# Patient Record
Sex: Female | Born: 2000 | Race: White | Hispanic: No | Marital: Single
Health system: Southern US, Community
[De-identification: ages and names within clinical notes are randomized; demographics above are authoritative.]

## PROBLEM LIST (undated history)

## (undated) ENCOUNTER — Inpatient Hospital Stay (HOSPITAL_COMMUNITY): Payer: Self-pay

## (undated) DIAGNOSIS — R000 Tachycardia, unspecified: Secondary | ICD-10-CM

## (undated) DIAGNOSIS — F419 Anxiety disorder, unspecified: Secondary | ICD-10-CM

## (undated) DIAGNOSIS — O139 Gestational [pregnancy-induced] hypertension without significant proteinuria, unspecified trimester: Secondary | ICD-10-CM

## (undated) HISTORY — PX: NO PAST SURGERIES: SHX2092

## (undated) HISTORY — DX: Anxiety disorder, unspecified: F41.9

## (undated) HISTORY — DX: Gestational (pregnancy-induced) hypertension without significant proteinuria, unspecified trimester: O13.9

---
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
## 2021-01-07 DIAGNOSIS — F4312 Post-traumatic stress disorder, chronic: Secondary | ICD-10-CM | POA: Diagnosis not present

## 2021-01-14 DIAGNOSIS — F4312 Post-traumatic stress disorder, chronic: Secondary | ICD-10-CM | POA: Diagnosis not present

## 2021-01-21 DIAGNOSIS — F4312 Post-traumatic stress disorder, chronic: Secondary | ICD-10-CM | POA: Diagnosis not present

## 2021-01-28 DIAGNOSIS — F4312 Post-traumatic stress disorder, chronic: Secondary | ICD-10-CM | POA: Diagnosis not present

## 2021-02-04 DIAGNOSIS — F4312 Post-traumatic stress disorder, chronic: Secondary | ICD-10-CM | POA: Diagnosis not present

## 2021-02-11 DIAGNOSIS — F4312 Post-traumatic stress disorder, chronic: Secondary | ICD-10-CM | POA: Diagnosis not present

## 2021-02-18 DIAGNOSIS — F4312 Post-traumatic stress disorder, chronic: Secondary | ICD-10-CM | POA: Diagnosis not present

## 2021-02-25 DIAGNOSIS — F4312 Post-traumatic stress disorder, chronic: Secondary | ICD-10-CM | POA: Diagnosis not present

## 2021-03-04 DIAGNOSIS — F4312 Post-traumatic stress disorder, chronic: Secondary | ICD-10-CM | POA: Diagnosis not present

## 2021-03-11 DIAGNOSIS — F4312 Post-traumatic stress disorder, chronic: Secondary | ICD-10-CM | POA: Diagnosis not present

## 2021-03-18 DIAGNOSIS — F4312 Post-traumatic stress disorder, chronic: Secondary | ICD-10-CM | POA: Diagnosis not present

## 2021-03-25 DIAGNOSIS — F4312 Post-traumatic stress disorder, chronic: Secondary | ICD-10-CM | POA: Diagnosis not present

## 2021-04-01 DIAGNOSIS — F4312 Post-traumatic stress disorder, chronic: Secondary | ICD-10-CM | POA: Diagnosis not present

## 2021-04-08 DIAGNOSIS — F4312 Post-traumatic stress disorder, chronic: Secondary | ICD-10-CM | POA: Diagnosis not present

## 2021-04-15 DIAGNOSIS — F4312 Post-traumatic stress disorder, chronic: Secondary | ICD-10-CM | POA: Diagnosis not present

## 2021-04-22 DIAGNOSIS — F4312 Post-traumatic stress disorder, chronic: Secondary | ICD-10-CM | POA: Diagnosis not present

## 2021-04-29 DIAGNOSIS — F4312 Post-traumatic stress disorder, chronic: Secondary | ICD-10-CM | POA: Diagnosis not present

## 2021-05-06 DIAGNOSIS — F4312 Post-traumatic stress disorder, chronic: Secondary | ICD-10-CM | POA: Diagnosis not present

## 2021-05-13 DIAGNOSIS — F4312 Post-traumatic stress disorder, chronic: Secondary | ICD-10-CM | POA: Diagnosis not present

## 2021-05-20 DIAGNOSIS — F4312 Post-traumatic stress disorder, chronic: Secondary | ICD-10-CM | POA: Diagnosis not present

## 2021-05-28 DIAGNOSIS — F4312 Post-traumatic stress disorder, chronic: Secondary | ICD-10-CM | POA: Diagnosis not present

## 2021-06-10 DIAGNOSIS — F4312 Post-traumatic stress disorder, chronic: Secondary | ICD-10-CM | POA: Diagnosis not present

## 2021-08-22 IMAGING — US US MFM OB COMP +14 WKS
1 series · 13 of 28 positions shown · non-contrast
Comparison: none

[Series 1: us mfm ob comp +14 wks · 13 of 109 slices shown]
[im 5/109]
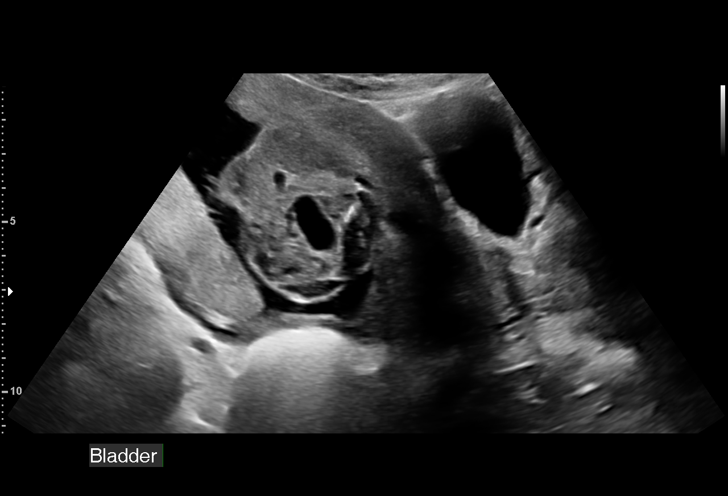
[im 13/109]
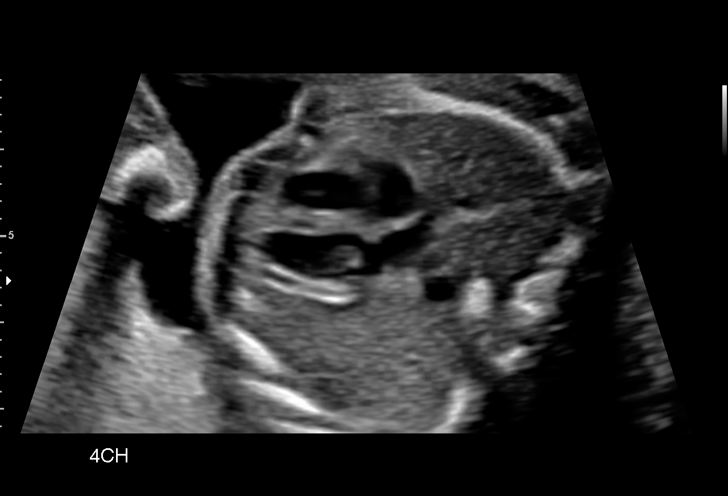
[im 21/109]
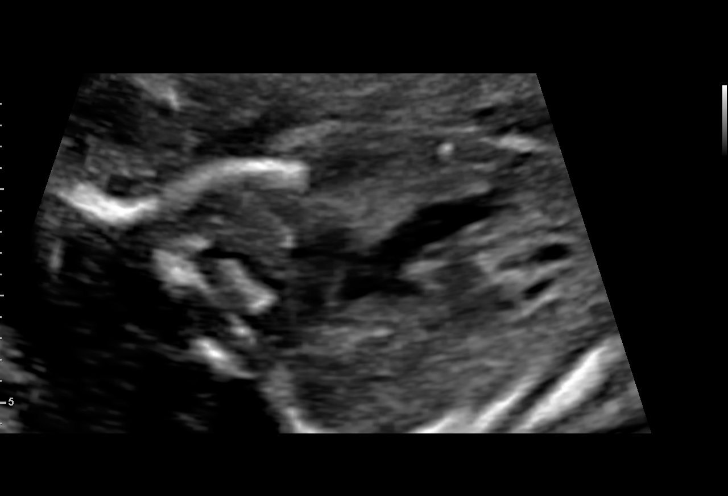
[im 29/109]
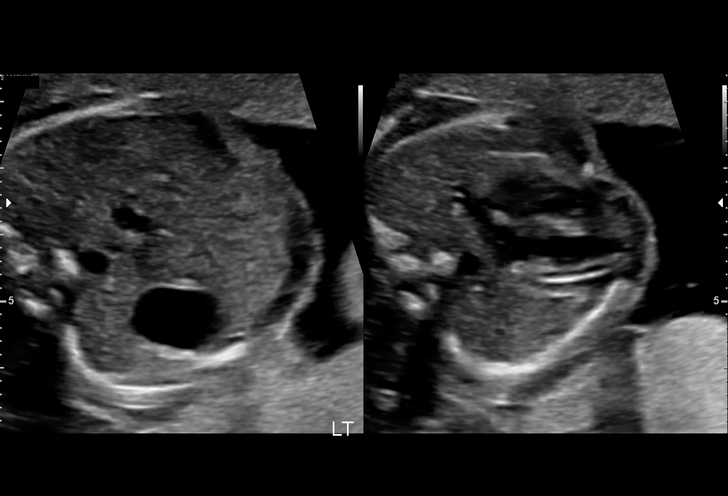
[im 37/109]
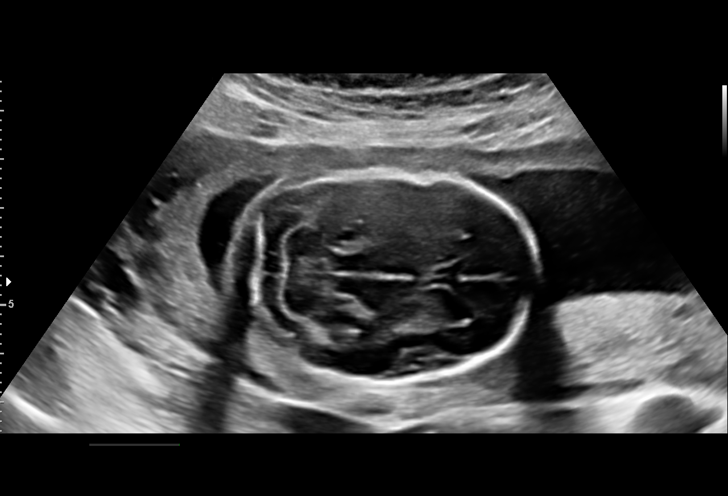
[im 45/109]
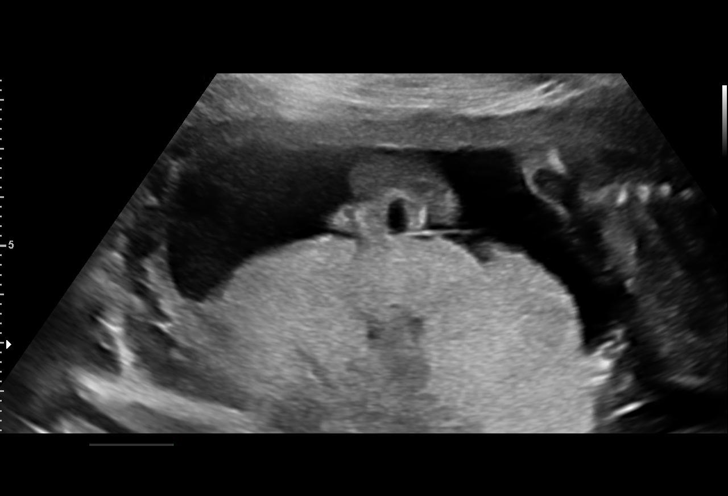
[im 57/109]
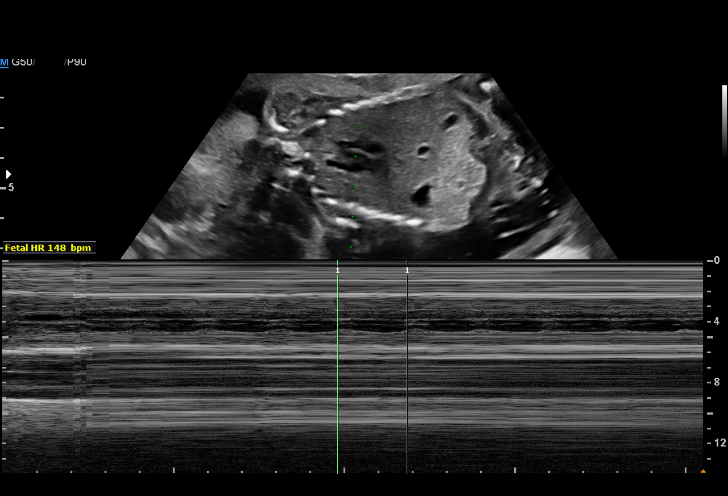
[im 65/109]
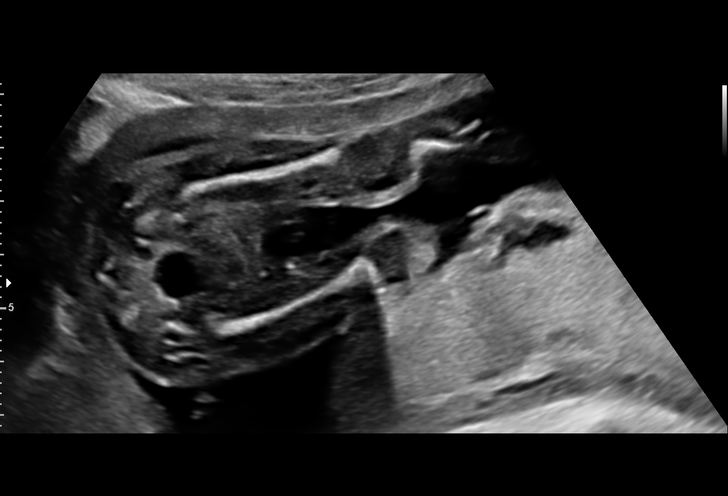
[im 73/109]
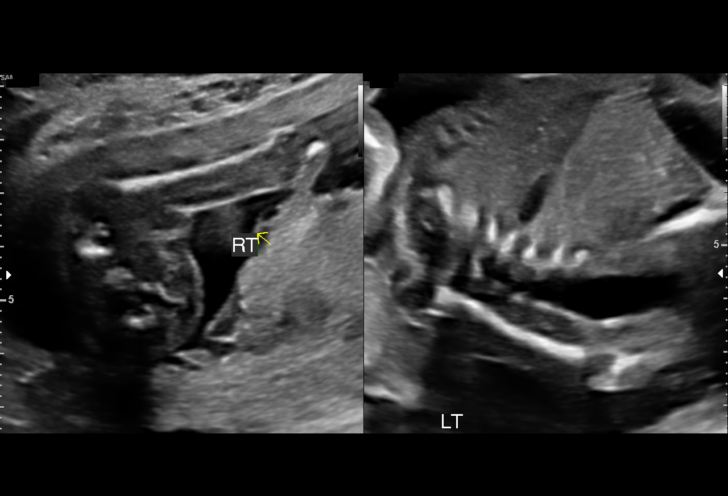
[im 81/109]
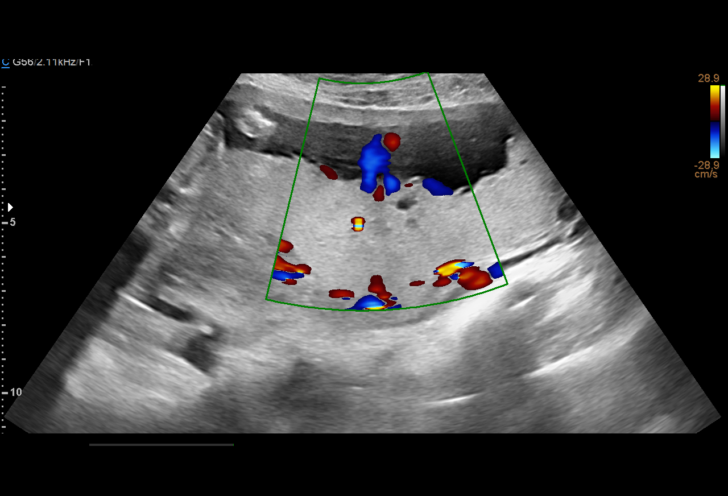
[im 89/109]
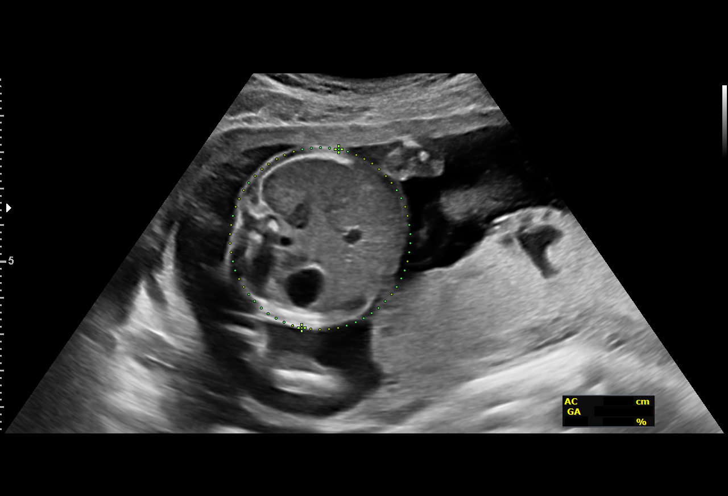
[im 97/109]
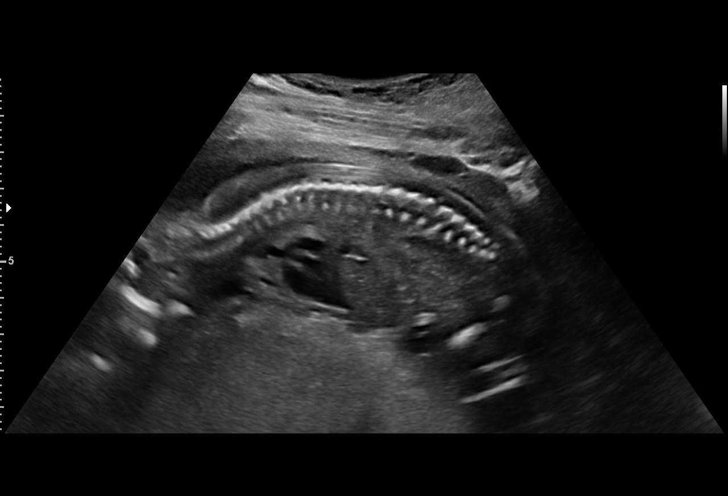
[im 105/109]
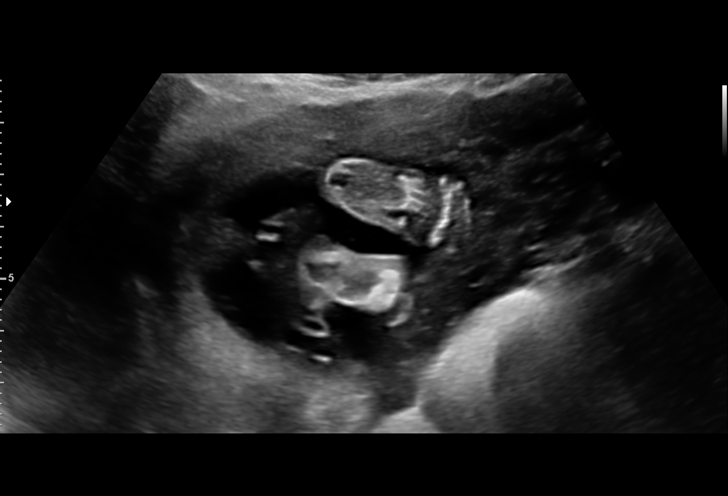

[13 of 28 positions shown; findings below may reference images not displayed]

Indications

 Poor obstetric history: Previous
 preeclampsia / eclampsia/gestational HTN
 (ASA)
 Encounter for antenatal screening for
 malformations
 19 weeks gestation of pregnancy
Fetal Evaluation

 Num Of Fetuses:         1
 Fetal Heart Rate(bpm):  148
 Cardiac Activity:       Observed
 Presentation:           Variable
 Placenta:               Posterior
 P. Cord Insertion:      Visualized, central

 Amniotic Fluid
 AFI FV:      Within normal limits

                             Largest Pocket(cm)

Biometry

 BPD:      45.6  mm     G. Age:  19w 5d         81  %    CI:        67.47   %    70 - 86
                                                         FL/HC:      17.3   %    16.1 -
 HC:      177.7  mm     G. Age:  20w 2d         91  %    HC/AC:      1.15        1.09 -
 AC:      154.2  mm     G. Age:  20w 4d         90  %    FL/BPD:     67.3   %
 FL:       30.7  mm     G. Age:  19w 4d         62  %    FL/AC:      19.9   %    20 - 24
 CER:      19.8  mm     G. Age:  18w 6d         47  %
 NFT:       4.2  mm
 CM:        4.1  mm
 Est. FW:     332  gm    0 lb 12 oz      96  %
OB History

 Gravidity:    2         Term:   1
 Living:       1
Gestational Age

 LMP:           19w 0d        Date:  12/20/19                 EDD:   09/25/20
 U/S Today:     20w 0d                                        EDD:   09/18/20
 Best:          19w 0d     Det. By:  LMP  (12/20/19)          EDD:   09/25/20
Anatomy

 Cranium:               Appears normal         LVOT:                   Appears normal
 Cavum:                 Appears normal         Aortic Arch:            Appears normal
 Ventricles:            Appears normal         Ductal Arch:            Appears normal
 Choroid Plexus:        Appears normal         Diaphragm:              Appears normal
 Cerebellum:            Appears normal         Stomach:                Appears normal, left
                                                                       sided
 Posterior Fossa:       Appears normal         Abdomen:                Appears normal
 Nuchal Fold:           Appears normal         Abdominal Wall:         Appears nml (cord
                                                                       insert, abd wall)
 Face:                  Appears normal         Cord Vessels:           Appears normal (3
                        (orbits and profile)                           vessel cord)
 Lips:                  Appears normal         Kidneys:                Appear normal
 Palate:                Appears normal         Bladder:                Appears normal
 Thoracic:              Appears normal         Spine:                  Appears normal
 Heart:                 Appears normal         Upper Extremities:      Appears normal
                        (4CH, axis, and
                        situs)
 RVOT:                  Appears normal         Lower Extremities:      Appears normal

 Other:  Fetus appears to be a male. Heels visualized. Open hands visualized.
         Nasal bone visualized.
Cervix Uterus Adnexa

 Cervix
 Length:            3.7  cm.
 Normal appearance by transabdominal scan.

 Uterus
 No abnormality visualized.

 Right Ovary
 No adnexal mass visualized.

 Left Ovary
 No adnexal mass visualized.

 Cul De Sac
 No free fluid seen.

 Adnexa
 No abnormality visualized.
Impression

 We performed fetal anatomy scan. No makers of
 aneuploidies or fetal structural defects are seen. Fetal
 biometry is 1 week ahead of established gestational age (by
 LMP). Amniotic fluid is normal and good fetal activity is seen.
 Patient understands the limitations of ultrasound in detecting
 fetal anomalies.

 On cell-free fetal DNA screening, the risks of fetal
 aneuploidies are not increased .
 Obstetric history is significant for a term vaginal delivery in
 [DATE] of a male infant weighing 3,476 g at birth. Her
 pregnancy was complicated by gestational hypertension.
Recommendations

 -An appointment was made for her to return in 4 weeks for
 fetal growth assessment (to confirm EDD).
                 Locklear, Savio

## 2021-09-12 IMAGING — DX DG HAND COMPLETE 3+V*R*
3 series · 3 of 3 positions shown · non-contrast
Comparison: None

CLINICAL DATA: RIGHT hand and wrist pain for 2 days, has been
moving and lifting heavy objects question injury, pain at thumb to
middle finger yesterday

EXAM:
RIGHT HAND - COMPLETE 3+ VIEW

[hand pa]
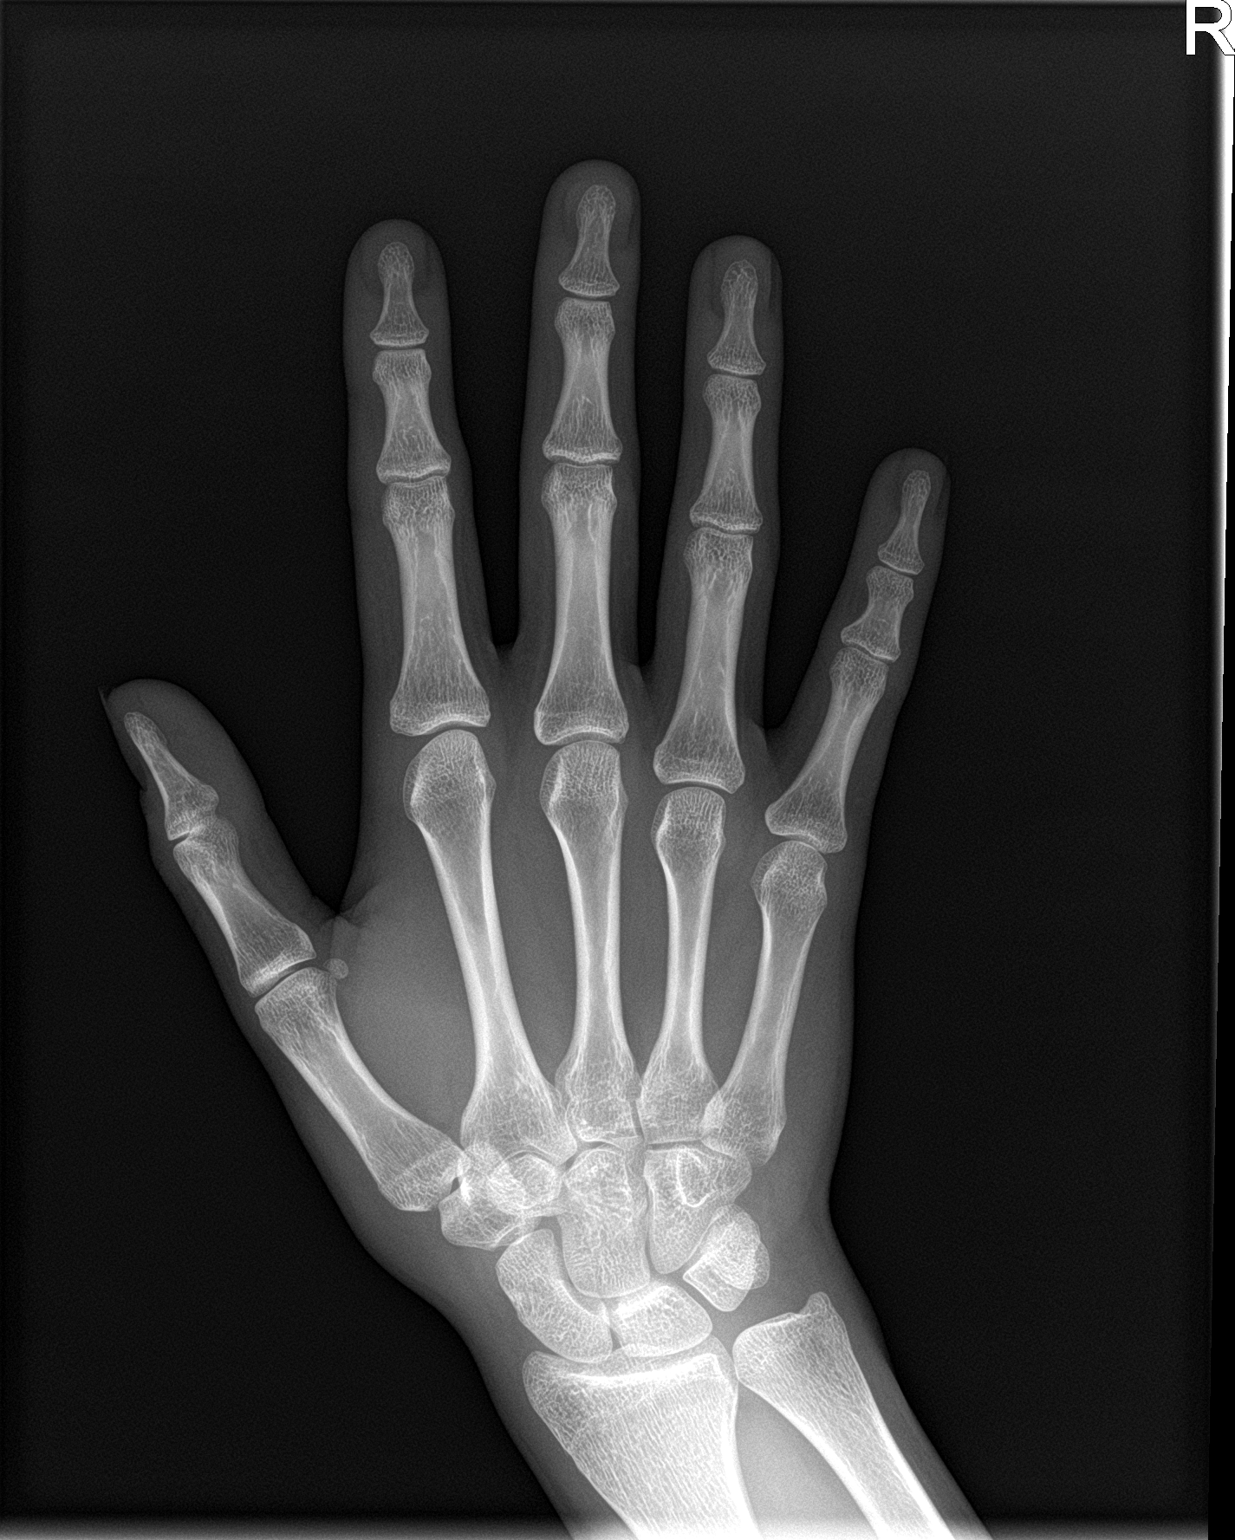

[hand obl]
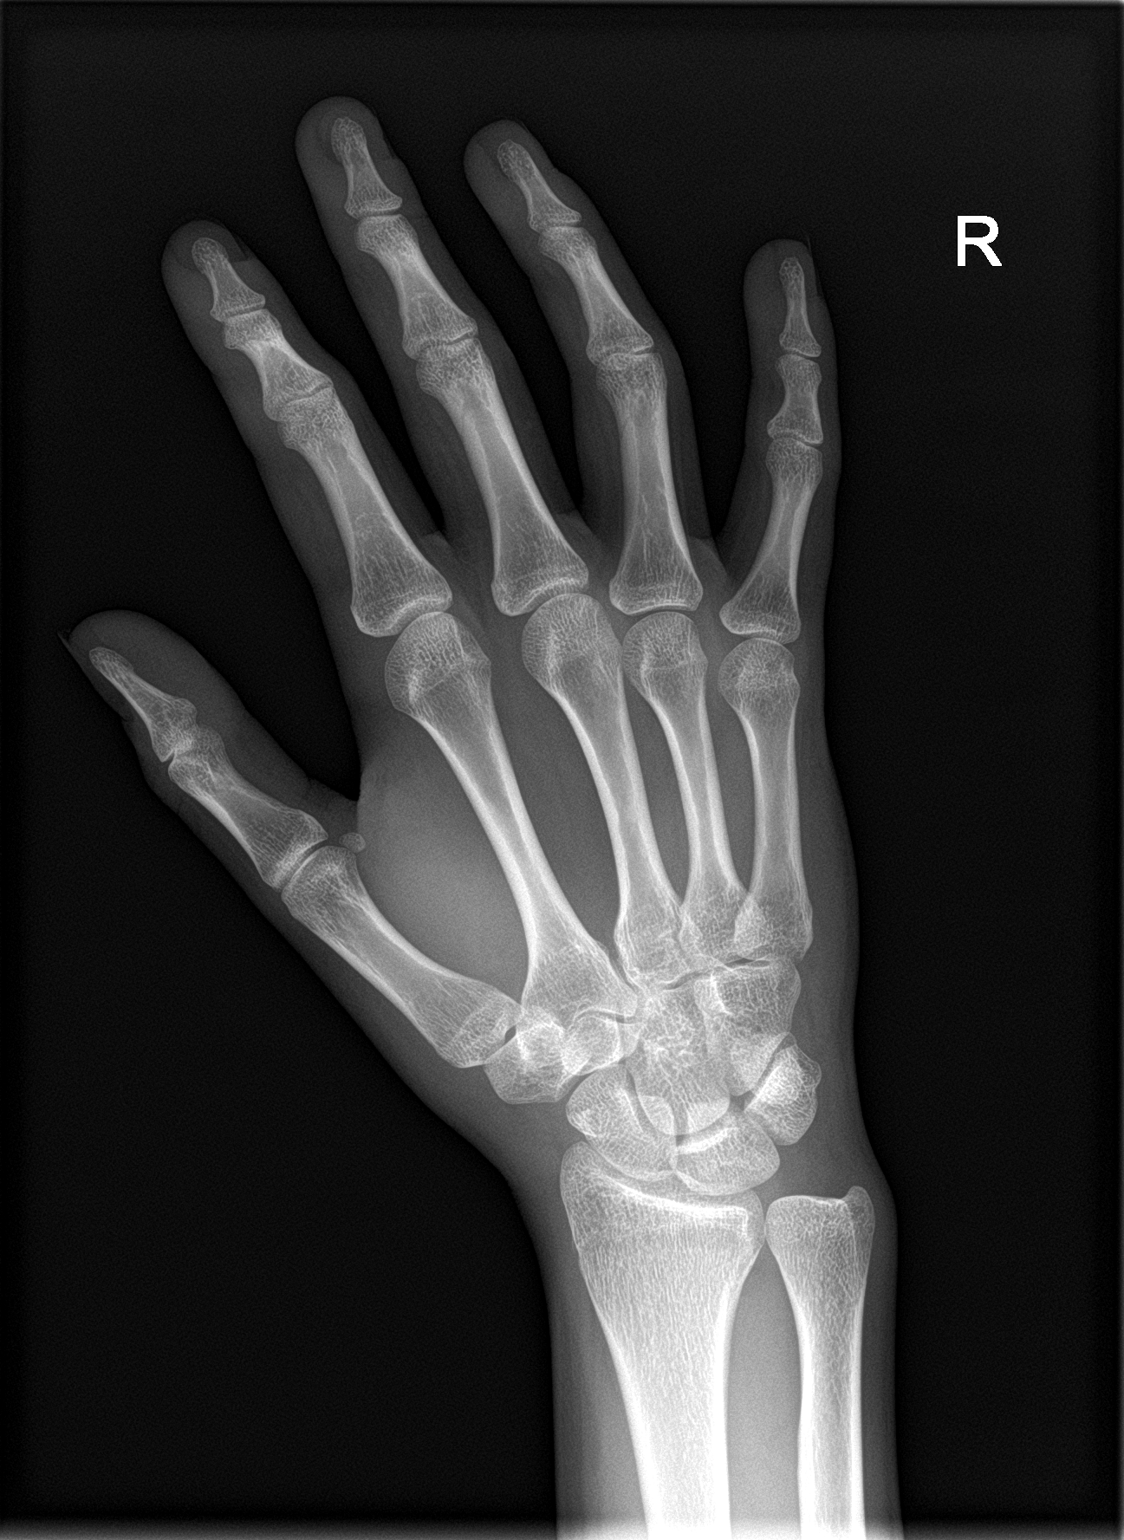

[hand lat]
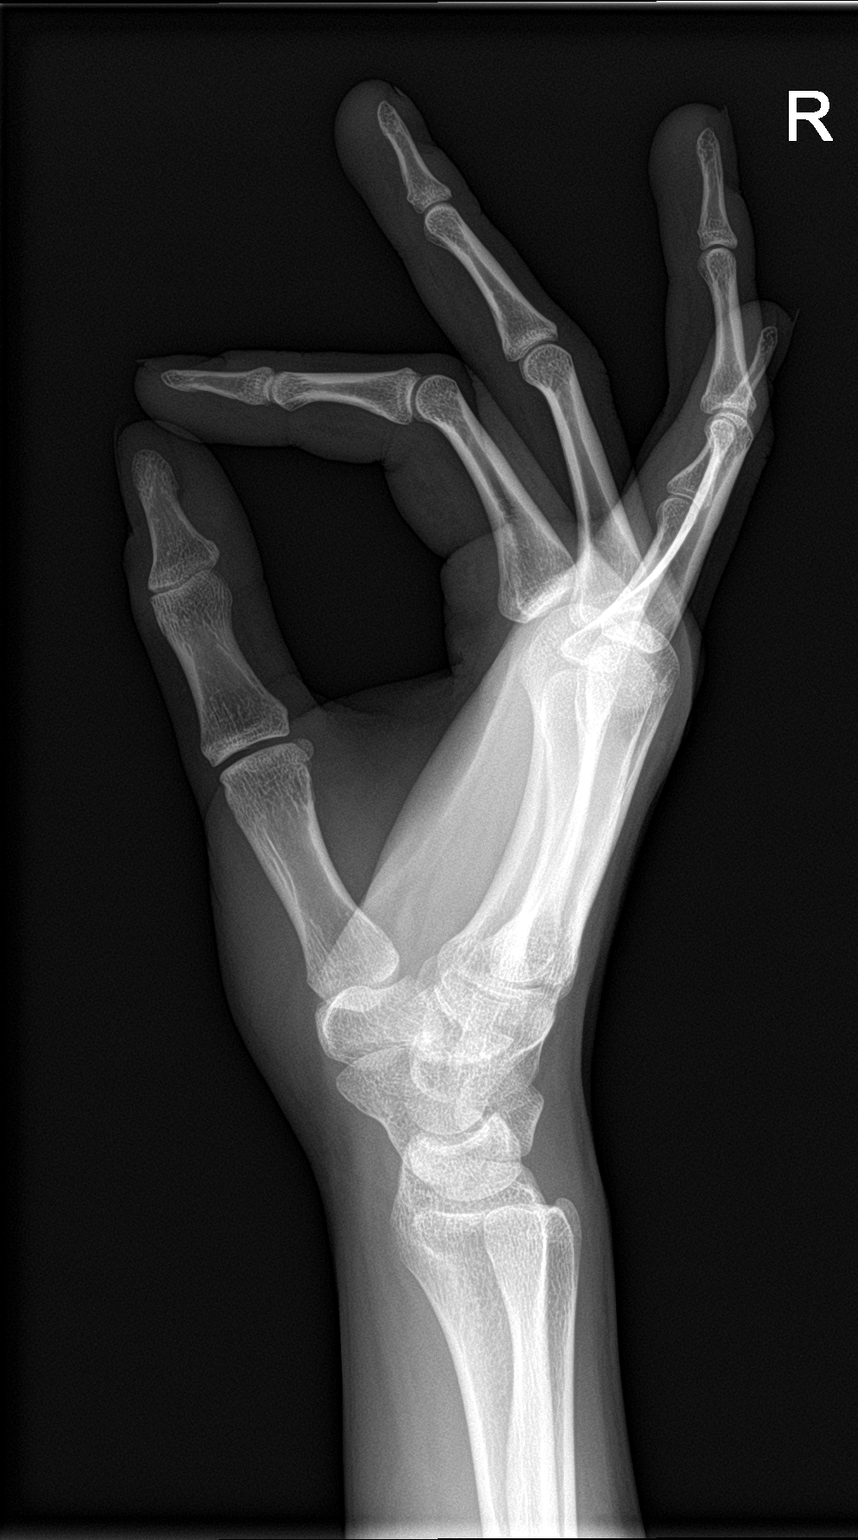

[3 of 3 positions shown; findings below may reference images not displayed]

FINDINGS: Osseous mineralization normal.

Joint spaces preserved.

No fracture, dislocation, or bone destruction.
IMPRESSION: Normal exam.

## 2021-10-04 IMAGING — US US MFM OB FOLLOW-UP
1 series · 13 of 28 positions shown · non-contrast
Comparison: none

[Series 1: us mfm ob follow-up · 13 of 58 slices shown]
[im 3/58]
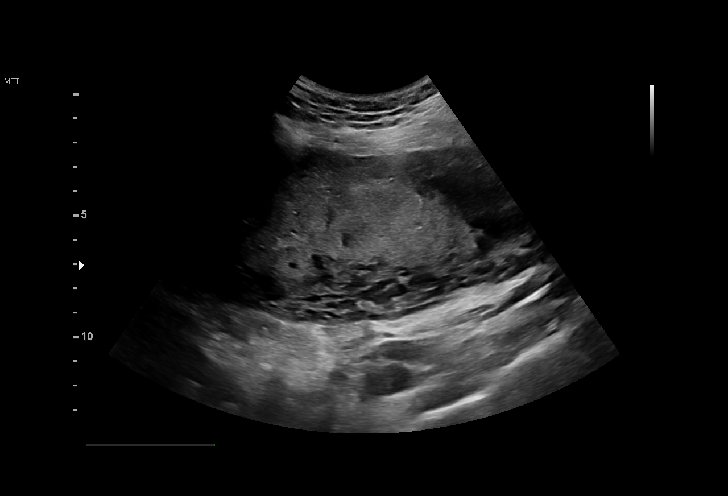
[im 7/58]
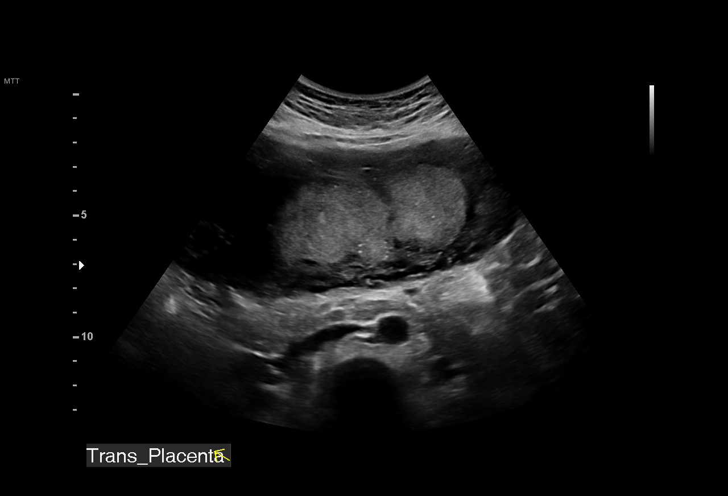
[im 11/58]
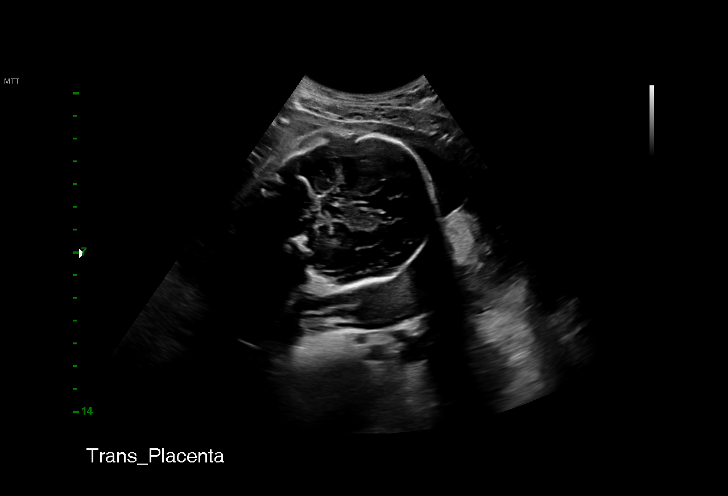
[im 15/58]
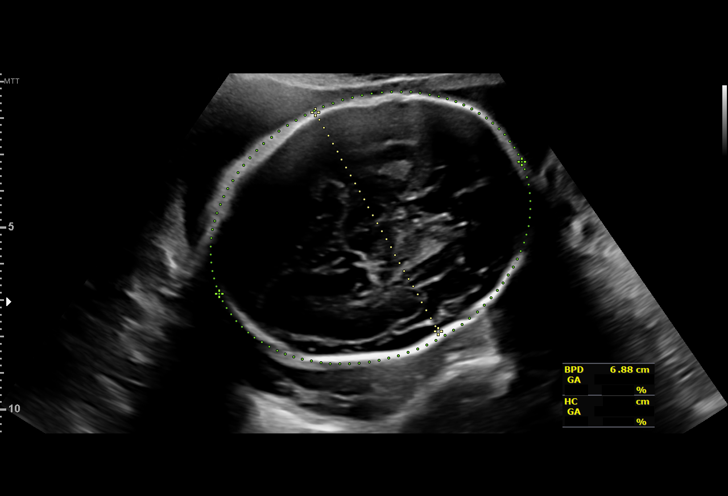
[im 20/58]
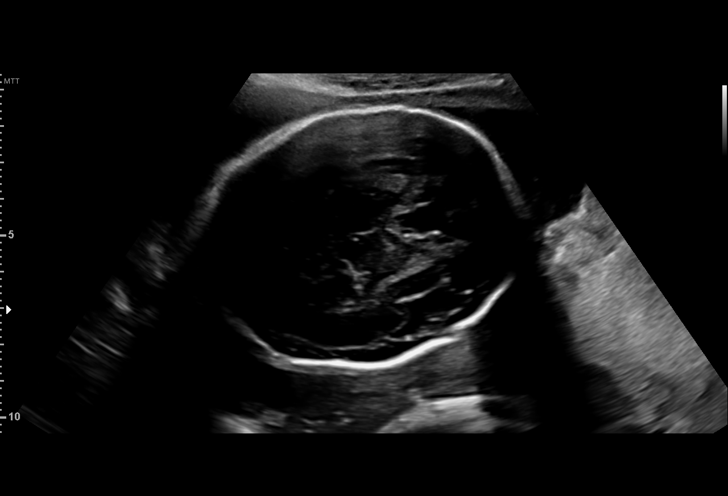
[im 24/58]
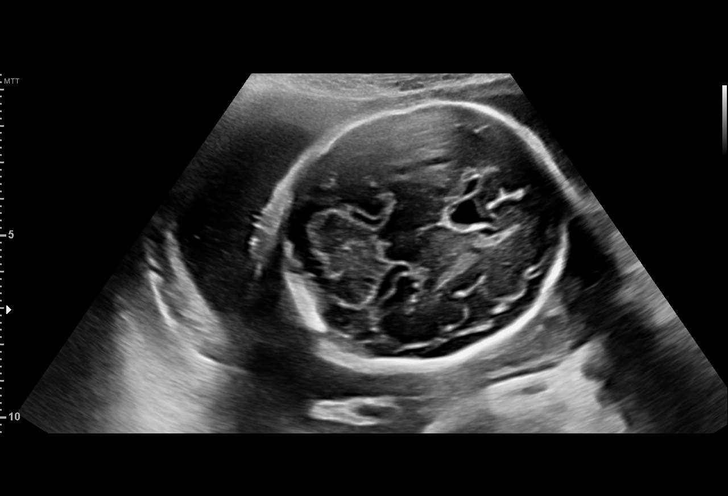
[im 30/58]
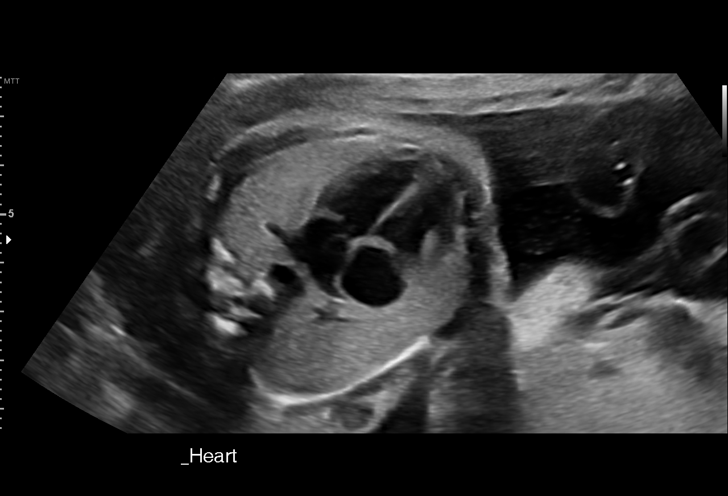
[im 34/58]
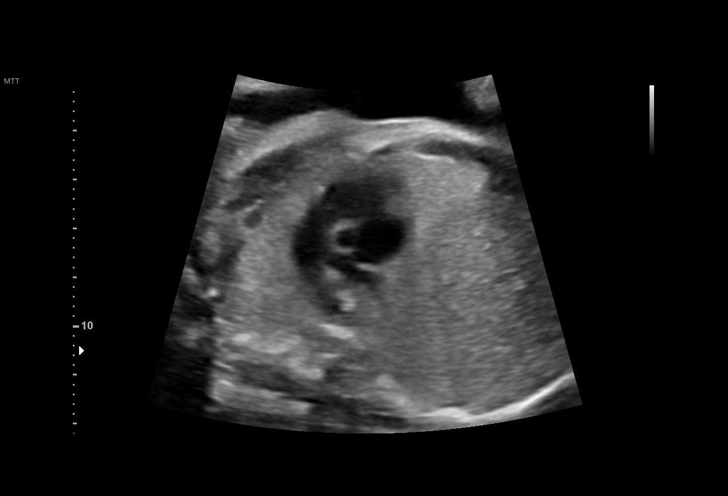
[im 39/58]
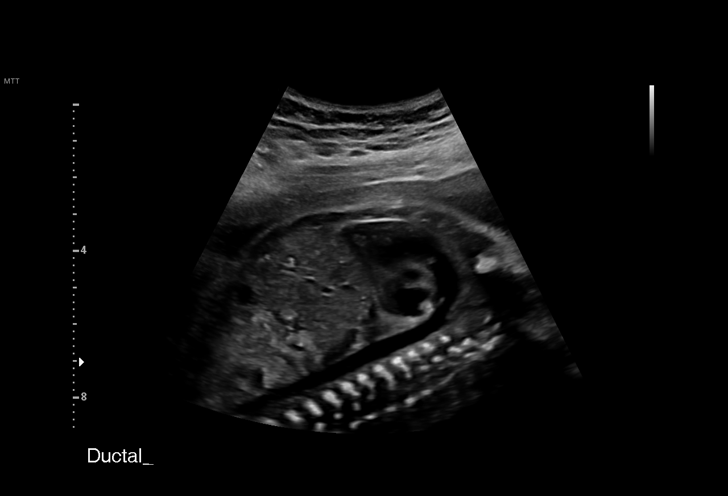
[im 43/58]
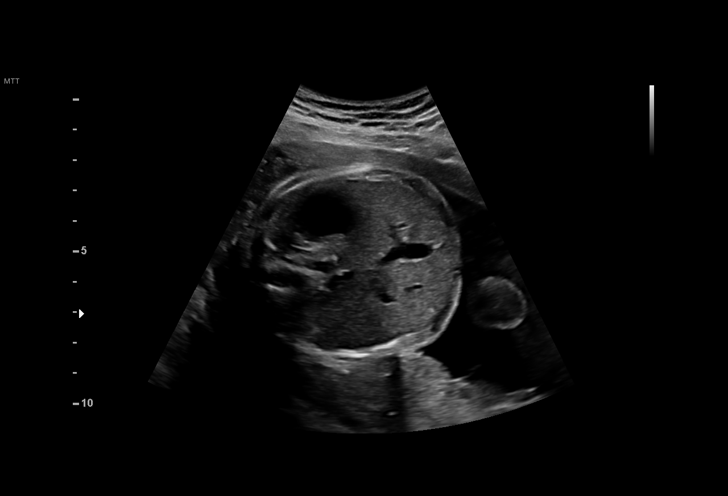
[im 47/58]
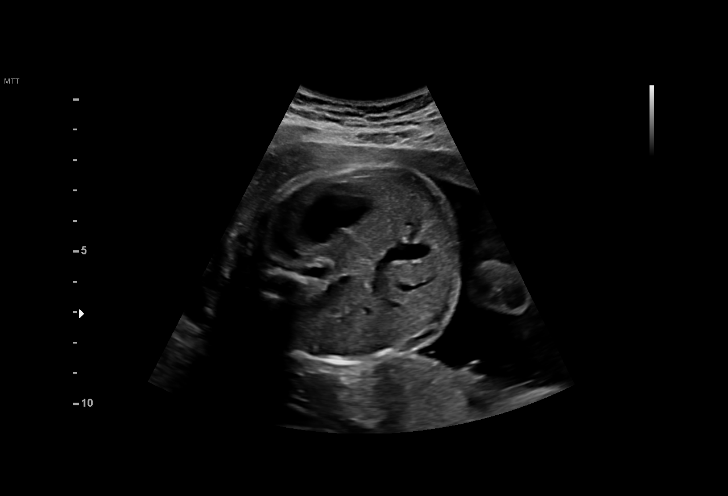
[im 51/58]
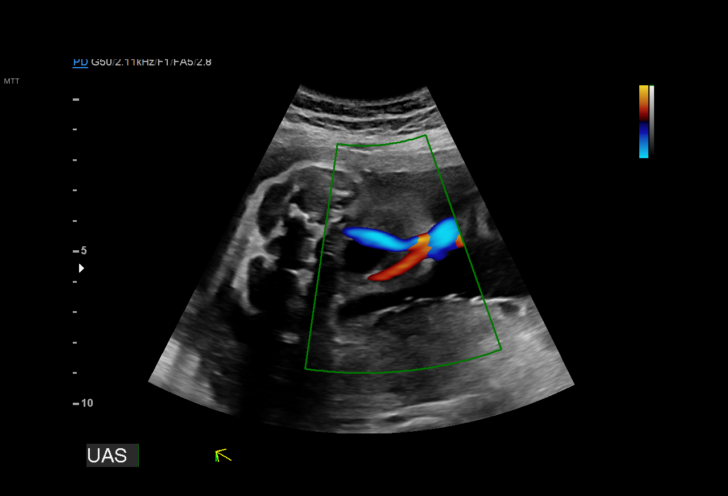
[im 55/58]
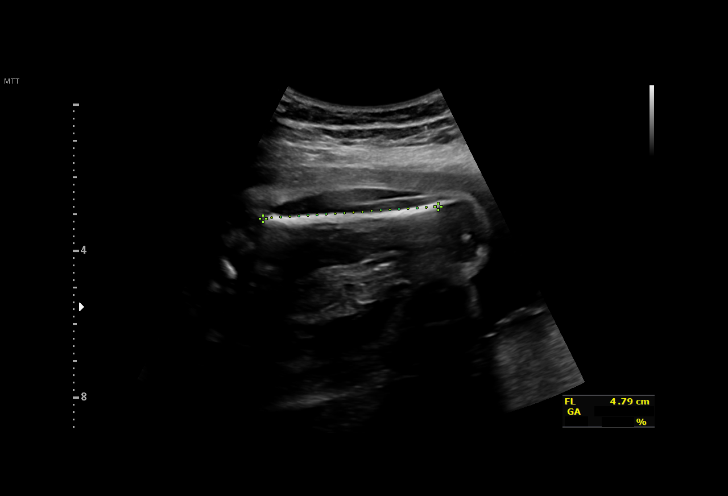

[13 of 28 positions shown; findings below may reference images not displayed]

Indications

 25 weeks gestation of pregnancy
 Poor obstetric history: Previous preeclampsia /
 eclampsia/gestational HTN (ASA)
 Encounter for other antenatal screening
 follow-up
 Size-Date Discrepancy
Fetal Evaluation

 Num Of Fetuses:          1
 Fetal Heart Rate(bpm):   125
 Cardiac Activity:        Observed
 Presentation:            Cephalic
 Placenta:                Posterior
 P. Cord Insertion:       Previously Visualized

 Amniotic Fluid
 AFI FV:      Within normal limits
Biometry

 BPD:      69.1   mm     G. Age:  27w 5d         99  %    CI:         74.55  %    70 - 86
                                                          FL/HC:       18.8  %    18.7 -
 HC:        254   mm     G. Age:  27w 4d         96  %    HC/AC:       1.14       1.04 -
 AC:      223.7   mm     G. Age:  26w 6d         87  %    FL/BPD:      69.2  %    71 - 87
 FL:       47.8   mm     G. Age:  26w 0d         63  %    FL/AC:       21.4  %    20 - 24
 CER:      28.6   mm     G. Age:  25w 2d         55  %

 LV:         4.4  mm

 Est. FW:     964   gm      2 lb 2 oz     94  %
OB History

 Gravidity:     2         Term:  1
 Living:        1
Gestational Age

 LMP:            25w 1d       Date:  12/20/19                   EDD:  09/25/20
 U/S Today:      27w 0d                                         EDD:  09/12/20
 Best:           25w 1d    Det. By:  LMP  (12/20/19)            EDD:  09/25/20
Anatomy

 Cranium:                Appears normal         LVOT:                   Appears normal
 Cavum:                  Appears normal         Aortic Arch:            Previously seen
 Ventricles:             Appears normal         Ductal Arch:            Previously seen
 Choroid Plexus:         Previously seen        Diaphragm:              Appears normal
 Cerebellum:             Appears normal         Stomach:                Appears normal, left
                                                                        sided
 Posterior Fossa:        Previously seen        Abdomen:                Appears normal
 Nuchal Fold:            Previously seen        Abdominal Wall:         Appears nml (cord
                                                                        insert, abd wall)
 Face:                   Orbits and profile     Cord Vessels:           Appears normal (3
                         previously seen                                vessel cord)
 Lips:                   Appears normal         Kidneys:                Appear normal
 Palate:                 Previously seen        Bladder:                Appears normal
 Thoracic:               Previously seen        Spine:                  Previously seen
 Heart:                  Appears normal         Upper Extremities:      Previously seen
                         (4CH, axis, and situs)
 RVOT:                   Appears normal         Lower Extremities:      Previously seen

 Other:   Fetus appears to be a male. Heels visualized. Open hands visualized.
          Nasal bone visualized.
Cervix Uterus Adnexa

 Cervix
 Length:            3.29  cm.
 Normal appearance by transabdominal scan.

 Uterus
 No abnormality visualized.

 Right Ovary
 Within normal limits. No adnexal mass visualized.

 Left Ovary
 Within normal limits. No adnexal mass visualized.

 Cul De Sac
 No free fluid seen.

 Adnexa
 No abnormality visualized.
Impression

 Follow up growth with measurement consistent with dates
 Good fetal movement and amniotic fluid
 Ms. Godenschweig had a large for gestational age with EFW 94%.
Recommendations

 Follow up as clinically indicated.

## 2024-01-11 DIAGNOSIS — R42 Dizziness and giddiness: Secondary | ICD-10-CM | POA: Diagnosis not present

## 2024-01-11 DIAGNOSIS — R0981 Nasal congestion: Secondary | ICD-10-CM | POA: Diagnosis not present

## 2024-01-11 DIAGNOSIS — R059 Cough, unspecified: Secondary | ICD-10-CM | POA: Diagnosis not present

## 2024-01-11 DIAGNOSIS — Z5321 Procedure and treatment not carried out due to patient leaving prior to being seen by health care provider: Secondary | ICD-10-CM | POA: Diagnosis not present

## 2024-01-11 DIAGNOSIS — M545 Low back pain, unspecified: Secondary | ICD-10-CM | POA: Diagnosis not present

## 2024-01-11 DIAGNOSIS — R0781 Pleurodynia: Secondary | ICD-10-CM | POA: Diagnosis not present

## 2024-02-09 DIAGNOSIS — O09299 Supervision of pregnancy with other poor reproductive or obstetric history, unspecified trimester: Secondary | ICD-10-CM | POA: Diagnosis not present

## 2024-02-09 DIAGNOSIS — O3680X0 Pregnancy with inconclusive fetal viability, not applicable or unspecified: Secondary | ICD-10-CM | POA: Diagnosis not present

## 2024-02-09 DIAGNOSIS — Z3689 Encounter for other specified antenatal screening: Secondary | ICD-10-CM | POA: Diagnosis not present

## 2024-02-09 DIAGNOSIS — Z3A08 8 weeks gestation of pregnancy: Secondary | ICD-10-CM | POA: Diagnosis not present

## 2024-02-09 DIAGNOSIS — Z862 Personal history of diseases of the blood and blood-forming organs and certain disorders involving the immune mechanism: Secondary | ICD-10-CM | POA: Diagnosis not present

## 2024-02-09 DIAGNOSIS — Z3A09 9 weeks gestation of pregnancy: Secondary | ICD-10-CM | POA: Diagnosis not present

## 2024-02-09 DIAGNOSIS — Z3201 Encounter for pregnancy test, result positive: Secondary | ICD-10-CM | POA: Diagnosis not present

## 2024-02-09 DIAGNOSIS — N912 Amenorrhea, unspecified: Secondary | ICD-10-CM | POA: Diagnosis not present

## 2024-03-22 DIAGNOSIS — Z3482 Encounter for supervision of other normal pregnancy, second trimester: Secondary | ICD-10-CM | POA: Diagnosis not present

## 2024-03-22 DIAGNOSIS — Z3689 Encounter for other specified antenatal screening: Secondary | ICD-10-CM | POA: Diagnosis not present

## 2024-04-20 DIAGNOSIS — Z3689 Encounter for other specified antenatal screening: Secondary | ICD-10-CM | POA: Diagnosis not present

## 2024-04-20 DIAGNOSIS — Z3A19 19 weeks gestation of pregnancy: Secondary | ICD-10-CM | POA: Diagnosis not present

## 2024-06-21 DIAGNOSIS — Z3482 Encounter for supervision of other normal pregnancy, second trimester: Secondary | ICD-10-CM | POA: Diagnosis not present

## 2024-06-21 DIAGNOSIS — Z23 Encounter for immunization: Secondary | ICD-10-CM | POA: Diagnosis not present

## 2024-06-21 DIAGNOSIS — Z3689 Encounter for other specified antenatal screening: Secondary | ICD-10-CM | POA: Diagnosis not present

## 2024-08-10 DIAGNOSIS — Z7982 Long term (current) use of aspirin: Secondary | ICD-10-CM | POA: Diagnosis not present

## 2024-08-10 DIAGNOSIS — R519 Headache, unspecified: Secondary | ICD-10-CM | POA: Diagnosis not present

## 2024-08-10 DIAGNOSIS — Z880 Allergy status to penicillin: Secondary | ICD-10-CM | POA: Diagnosis not present

## 2024-08-10 DIAGNOSIS — R0781 Pleurodynia: Secondary | ICD-10-CM | POA: Diagnosis not present

## 2024-08-10 DIAGNOSIS — O99891 Other specified diseases and conditions complicating pregnancy: Secondary | ICD-10-CM | POA: Diagnosis not present

## 2024-08-10 DIAGNOSIS — R0602 Shortness of breath: Secondary | ICD-10-CM | POA: Diagnosis not present

## 2024-08-10 DIAGNOSIS — Z8759 Personal history of other complications of pregnancy, childbirth and the puerperium: Secondary | ICD-10-CM | POA: Diagnosis not present

## 2024-08-10 DIAGNOSIS — R0609 Other forms of dyspnea: Secondary | ICD-10-CM | POA: Diagnosis not present

## 2024-08-10 DIAGNOSIS — Z3A35 35 weeks gestation of pregnancy: Secondary | ICD-10-CM | POA: Diagnosis not present

## 2024-08-10 DIAGNOSIS — R0600 Dyspnea, unspecified: Secondary | ICD-10-CM | POA: Diagnosis not present

## 2024-08-10 DIAGNOSIS — O26893 Other specified pregnancy related conditions, third trimester: Secondary | ICD-10-CM | POA: Diagnosis not present

## 2024-08-13 DIAGNOSIS — O99891 Other specified diseases and conditions complicating pregnancy: Secondary | ICD-10-CM | POA: Diagnosis not present

## 2024-08-13 DIAGNOSIS — R519 Headache, unspecified: Secondary | ICD-10-CM | POA: Diagnosis not present

## 2024-08-15 DIAGNOSIS — Z3685 Encounter for antenatal screening for Streptococcus B: Secondary | ICD-10-CM | POA: Diagnosis not present

## 2024-08-15 DIAGNOSIS — Z3689 Encounter for other specified antenatal screening: Secondary | ICD-10-CM | POA: Diagnosis not present

## 2024-08-31 DIAGNOSIS — R110 Nausea: Secondary | ICD-10-CM | POA: Diagnosis not present

## 2024-09-01 DIAGNOSIS — Z3A38 38 weeks gestation of pregnancy: Secondary | ICD-10-CM | POA: Diagnosis not present

## 2024-09-01 DIAGNOSIS — O471 False labor at or after 37 completed weeks of gestation: Secondary | ICD-10-CM | POA: Diagnosis not present

## 2024-09-04 DIAGNOSIS — Z880 Allergy status to penicillin: Secondary | ICD-10-CM | POA: Diagnosis not present

## 2024-09-04 DIAGNOSIS — Z3A38 38 weeks gestation of pregnancy: Secondary | ICD-10-CM | POA: Diagnosis not present

## 2024-09-04 DIAGNOSIS — Z87891 Personal history of nicotine dependence: Secondary | ICD-10-CM | POA: Diagnosis not present

## 2024-10-19 DIAGNOSIS — Z3202 Encounter for pregnancy test, result negative: Secondary | ICD-10-CM | POA: Diagnosis not present

## 2024-10-19 DIAGNOSIS — Z30430 Encounter for insertion of intrauterine contraceptive device: Secondary | ICD-10-CM | POA: Diagnosis not present
